# Patient Record
Sex: Male | Born: 1935 | Race: White
Health system: Southern US, Community
[De-identification: ages and names within clinical notes are randomized; demographics above are authoritative.]

## PROBLEM LIST (undated history)

## (undated) DIAGNOSIS — E7800 Pure hypercholesterolemia, unspecified: Secondary | ICD-10-CM

## (undated) DIAGNOSIS — I4891 Unspecified atrial fibrillation: Secondary | ICD-10-CM

## (undated) DIAGNOSIS — E119 Type 2 diabetes mellitus without complications: Secondary | ICD-10-CM

## (undated) DIAGNOSIS — Z7901 Long term (current) use of anticoagulants: Secondary | ICD-10-CM

## (undated) HISTORY — PX: BACK SURGERY: SHX140

---

## 2012-08-02 ENCOUNTER — Ambulatory Visit: Payer: Medicare Other | Attending: Neurological Surgery | Admitting: Physical Therapy

## 2012-08-02 DIAGNOSIS — IMO0001 Reserved for inherently not codable concepts without codable children: Secondary | ICD-10-CM | POA: Insufficient documentation

## 2012-08-02 DIAGNOSIS — M545 Low back pain, unspecified: Secondary | ICD-10-CM | POA: Insufficient documentation

## 2012-08-04 ENCOUNTER — Ambulatory Visit: Payer: Medicare Other | Admitting: Physical Therapy

## 2012-08-09 ENCOUNTER — Ambulatory Visit: Payer: Medicare Other | Admitting: Physical Therapy

## 2012-08-11 ENCOUNTER — Ambulatory Visit: Payer: Medicare Other | Admitting: Physical Therapy

## 2012-08-16 ENCOUNTER — Ambulatory Visit: Payer: Medicare Other | Attending: Neurological Surgery | Admitting: Physical Therapy

## 2012-08-16 DIAGNOSIS — M545 Low back pain, unspecified: Secondary | ICD-10-CM | POA: Insufficient documentation

## 2012-08-16 DIAGNOSIS — IMO0001 Reserved for inherently not codable concepts without codable children: Secondary | ICD-10-CM | POA: Insufficient documentation

## 2012-08-18 ENCOUNTER — Ambulatory Visit: Payer: Medicare Other | Admitting: Physical Therapy

## 2012-08-23 ENCOUNTER — Ambulatory Visit: Payer: Medicare Other | Admitting: Physical Therapy

## 2012-08-25 ENCOUNTER — Ambulatory Visit: Payer: Medicare Other | Admitting: Physical Therapy

## 2012-08-30 ENCOUNTER — Ambulatory Visit: Payer: Medicare Other | Admitting: Physical Therapy

## 2012-09-01 ENCOUNTER — Ambulatory Visit: Payer: Medicare Other | Admitting: Physical Therapy

## 2016-08-17 ENCOUNTER — Emergency Department (HOSPITAL_COMMUNITY): Payer: Medicare Other

## 2016-08-17 ENCOUNTER — Emergency Department (HOSPITAL_COMMUNITY)
Admission: EM | Admit: 2016-08-17 | Discharge: 2016-08-17 | Disposition: A | Discharge status: Other Acute Inpatient Hospital | Payer: Medicare Other | Attending: Emergency Medicine | Admitting: Emergency Medicine

## 2016-08-17 ENCOUNTER — Encounter (HOSPITAL_COMMUNITY): Payer: Self-pay | Admitting: *Deleted

## 2016-08-17 DIAGNOSIS — S52092C Other fracture of upper end of left ulna, initial encounter for open fracture type IIIA, IIIB, or IIIC: Secondary | ICD-10-CM | POA: Insufficient documentation

## 2016-08-17 DIAGNOSIS — Y999 Unspecified external cause status: Secondary | ICD-10-CM | POA: Diagnosis not present

## 2016-08-17 DIAGNOSIS — S52002C Unspecified fracture of upper end of left ulna, initial encounter for open fracture type IIIA, IIIB, or IIIC: Secondary | ICD-10-CM

## 2016-08-17 DIAGNOSIS — E119 Type 2 diabetes mellitus without complications: Secondary | ICD-10-CM | POA: Insufficient documentation

## 2016-08-17 DIAGNOSIS — S0990XA Unspecified injury of head, initial encounter: Secondary | ICD-10-CM | POA: Diagnosis not present

## 2016-08-17 DIAGNOSIS — T148XXA Other injury of unspecified body region, initial encounter: Secondary | ICD-10-CM

## 2016-08-17 DIAGNOSIS — Y9389 Activity, other specified: Secondary | ICD-10-CM | POA: Insufficient documentation

## 2016-08-17 DIAGNOSIS — S42402B Unspecified fracture of lower end of left humerus, initial encounter for open fracture: Secondary | ICD-10-CM

## 2016-08-17 DIAGNOSIS — Z182 Retained plastic fragments: Secondary | ICD-10-CM | POA: Diagnosis not present

## 2016-08-17 DIAGNOSIS — S0083XA Contusion of other part of head, initial encounter: Secondary | ICD-10-CM | POA: Insufficient documentation

## 2016-08-17 DIAGNOSIS — Z7984 Long term (current) use of oral hypoglycemic drugs: Secondary | ICD-10-CM | POA: Insufficient documentation

## 2016-08-17 DIAGNOSIS — Z7901 Long term (current) use of anticoagulants: Secondary | ICD-10-CM | POA: Diagnosis not present

## 2016-08-17 DIAGNOSIS — S22009A Unspecified fracture of unspecified thoracic vertebra, initial encounter for closed fracture: Secondary | ICD-10-CM

## 2016-08-17 DIAGNOSIS — R938 Abnormal findings on diagnostic imaging of other specified body structures: Secondary | ICD-10-CM | POA: Diagnosis not present

## 2016-08-17 DIAGNOSIS — L923 Foreign body granuloma of the skin and subcutaneous tissue: Secondary | ICD-10-CM | POA: Diagnosis not present

## 2016-08-17 DIAGNOSIS — S42462B Displaced fracture of medial condyle of left humerus, initial encounter for open fracture: Secondary | ICD-10-CM | POA: Insufficient documentation

## 2016-08-17 DIAGNOSIS — S59912A Unspecified injury of left forearm, initial encounter: Secondary | ICD-10-CM | POA: Diagnosis present

## 2016-08-17 DIAGNOSIS — S22068A Other fracture of T7-T8 thoracic vertebra, initial encounter for closed fracture: Secondary | ICD-10-CM | POA: Insufficient documentation

## 2016-08-17 DIAGNOSIS — Y92410 Unspecified street and highway as the place of occurrence of the external cause: Secondary | ICD-10-CM | POA: Insufficient documentation

## 2016-08-17 HISTORY — DX: Unspecified atrial fibrillation: I48.91

## 2016-08-17 HISTORY — DX: Pure hypercholesterolemia, unspecified: E78.00

## 2016-08-17 HISTORY — DX: Type 2 diabetes mellitus without complications: E11.9

## 2016-08-17 HISTORY — DX: Long term (current) use of anticoagulants: Z79.01

## 2016-08-17 LAB — I-STAT CHEM 8, ED
BUN: 22 mg/dL — ABNORMAL HIGH (ref 6–20)
CHLORIDE: 103 mmol/L (ref 101–111)
Calcium, Ion: 1.09 mmol/L — ABNORMAL LOW (ref 1.15–1.40)
Creatinine, Ser: 1.3 mg/dL — ABNORMAL HIGH (ref 0.61–1.24)
GLUCOSE: 253 mg/dL — AB (ref 65–99)
HEMATOCRIT: 38 % — AB (ref 39.0–52.0)
Hemoglobin: 12.9 g/dL — ABNORMAL LOW (ref 13.0–17.0)
POTASSIUM: 4.6 mmol/L (ref 3.5–5.1)
Sodium: 138 mmol/L (ref 135–145)
TCO2: 24 mmol/L (ref 0–100)

## 2016-08-17 LAB — SAMPLE TO BLOOD BANK

## 2016-08-17 LAB — CBC
HCT: 38.4 % — ABNORMAL LOW (ref 39.0–52.0)
Hemoglobin: 12.7 g/dL — ABNORMAL LOW (ref 13.0–17.0)
MCH: 27.7 pg (ref 26.0–34.0)
MCHC: 33.1 g/dL (ref 30.0–36.0)
MCV: 83.8 fL (ref 78.0–100.0)
Platelets: 169 10*3/uL (ref 150–400)
RBC: 4.58 MIL/uL (ref 4.22–5.81)
RDW: 15.4 % (ref 11.5–15.5)
WBC: 13.3 10*3/uL — ABNORMAL HIGH (ref 4.0–10.5)

## 2016-08-17 LAB — COMPREHENSIVE METABOLIC PANEL
ALK PHOS: 54 U/L (ref 38–126)
ALT: 29 U/L (ref 17–63)
AST: 56 U/L — AB (ref 15–41)
Albumin: 3.5 g/dL (ref 3.5–5.0)
Anion gap: 11 (ref 5–15)
BUN: 17 mg/dL (ref 6–20)
CALCIUM: 8.8 mg/dL — AB (ref 8.9–10.3)
CO2: 21 mmol/L — AB (ref 22–32)
CREATININE: 1.37 mg/dL — AB (ref 0.61–1.24)
Chloride: 103 mmol/L (ref 101–111)
GFR calc non Af Amer: 45 mL/min — ABNORMAL LOW (ref >60)
GFR, EST AFRICAN AMERICAN: 53 mL/min — AB (ref >60)
Glucose, Bld: 247 mg/dL — ABNORMAL HIGH (ref 65–99)
Potassium: 4.6 mmol/L (ref 3.5–5.1)
SODIUM: 135 mmol/L (ref 135–145)
Total Bilirubin: 0.6 mg/dL (ref 0.3–1.2)
Total Protein: 6.5 g/dL (ref 6.5–8.1)

## 2016-08-17 LAB — PROTIME-INR
INR: 2.25
Prothrombin Time: 25.3 seconds — ABNORMAL HIGH (ref 11.4–15.2)

## 2016-08-17 LAB — ETHANOL

## 2016-08-17 MED ORDER — HYDROMORPHONE HCL 1 MG/ML IJ SOLN
0.5000 mg | Freq: Once | INTRAMUSCULAR | Status: AC
  Administered 2016-08-17: 0.5 mg via INTRAVENOUS
  Filled 2016-08-17: qty 1

## 2016-08-17 MED ORDER — SODIUM CHLORIDE 0.9 % IV SOLN
Freq: Once | INTRAVENOUS | Status: AC
  Administered 2016-08-17: 19:00:00 via INTRAVENOUS

## 2016-08-17 MED ORDER — FENTANYL CITRATE (PF) 100 MCG/2ML IJ SOLN
50.0000 ug | Freq: Once | INTRAMUSCULAR | Status: AC
  Administered 2016-08-17: 50 ug via INTRAVENOUS
  Filled 2016-08-17: qty 2

## 2016-08-17 MED ORDER — HYDROMORPHONE HCL 1 MG/ML IJ SOLN
INTRAMUSCULAR | Status: AC
  Filled 2016-08-17: qty 1

## 2016-08-17 MED ORDER — CEFAZOLIN SODIUM-DEXTROSE 2-4 GM/100ML-% IV SOLN
2.0000 g | Freq: Once | INTRAVENOUS | Status: AC
  Administered 2016-08-17: 2 g via INTRAVENOUS
  Filled 2016-08-17: qty 100

## 2016-08-17 MED ORDER — FENTANYL CITRATE (PF) 100 MCG/2ML IJ SOLN
50.0000 ug | INTRAMUSCULAR | Status: DC | PRN
  Administered 2016-08-17: 50 ug via INTRAVENOUS
  Filled 2016-08-17: qty 2

## 2016-08-17 MED ORDER — TETANUS-DIPHTH-ACELL PERTUSSIS 5-2.5-18.5 LF-MCG/0.5 IM SUSP
0.5000 mL | Freq: Once | INTRAMUSCULAR | Status: AC
Start: 2016-08-17 — End: 2016-08-17
  Administered 2016-08-17: 0.5 mL via INTRAMUSCULAR
  Filled 2016-08-17: qty 0.5

## 2016-08-17 MED ORDER — IOPAMIDOL (ISOVUE-300) INJECTION 61%
INTRAVENOUS | Status: AC
  Administered 2016-08-17: 75 mL
  Filled 2016-08-17: qty 100

## 2016-08-17 NOTE — ED Notes (Signed)
Pt requesting pain medication. Leotis ShamesJeffery PA paged.

## 2016-08-17 NOTE — ED Notes (Signed)
Ortho tech arrived, but pt at CT.

## 2016-08-17 NOTE — ED Notes (Signed)
Pt transported to CT at this time.
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
## 2016-08-17 NOTE — ED Notes (Signed)
Dr Lynelle DoctorKnapp at bedside.  He does not feel numbness to R foot to be "code stroke".

## 2016-08-17 NOTE — Progress Notes (Signed)
Orthopedic Tech Progress Note Patient Details:  Willie Clements 11/30/1928 [id number]  Patient ID: Willie Clements, male   DOB: 03/13/1929, 83 y.o.   MRN: [id number]   Nikki DomCrawford, Trevon Strothers 08/17/2016, 1:48 PM Made level 2 trauma visit
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
## 2016-08-17 NOTE — ED Notes (Signed)
Ortho paged and responded. 

## 2016-08-17 NOTE — ED Notes (Signed)
[facility] trauma PA at bedside prior to pt being taken to CT.

## 2016-08-17 NOTE — Progress Notes (Signed)
Orthopedic Tech Progress Note Patient Details:  Willie RuffingGarner Cuccia 10/04/1928 [id number]  Ortho Devices Type of Ortho Device: Post (long arm) splint, Arm sling Ortho Device/Splint Location: provided long arm splint with arm sling to pt left arm.  pt tolerated fair.  [facility] [place]   Left Arm Ortho Device/Splint Interventions: Application, Adjustment   Alvina ChouWilliams, Shamika Pedregon C 08/17/2016, 8:00 PM

## 2016-08-17 NOTE — ED Notes (Signed)
[facility]
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
## 2016-08-17 NOTE — ED Notes (Signed)
[facility] called for transport, [facility] said 1-2 hrs before truck arrives.

## 2016-08-17 NOTE — ED Notes (Signed)
Pt has returned from radiology.  

## 2016-08-17 NOTE — ED Notes (Signed)
5c mcg wasted in sharps with Malen Gauzeandace K, RN

## 2016-08-17 NOTE — ED Notes (Signed)
EKG changed noted.. New ekg printed and shown to Dr. Hyacinth MeekerMiller. Dr. Hyacinth MeekerMiller also informed of pts BP of 95/55.

## 2016-08-17 NOTE — ED Notes (Signed)
Pt now being transported to CT
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
## 2019-03-17 DEATH — deceased
